# Patient Record
Sex: Male | Born: 1950 | Race: White | Hispanic: No
Health system: Southern US, Community
[De-identification: ages and names within clinical notes are randomized; demographics above are authoritative.]

## PROBLEM LIST (undated history)

## (undated) DIAGNOSIS — G8929 Other chronic pain: Secondary | ICD-10-CM

## (undated) HISTORY — PX: BACK SURGERY: SHX140

## (undated) HISTORY — PX: TOTAL SHOULDER REPLACEMENT: SUR1217

---

## 2002-02-12 ENCOUNTER — Encounter: Admission: RE | Admit: 2002-02-12 | Discharge: 2002-04-15 | Payer: Self-pay

## 2011-12-21 ENCOUNTER — Observation Stay (HOSPITAL_COMMUNITY)
Admission: EM | Admit: 2011-12-21 | Discharge: 2011-12-21 | Disposition: A | Discharge status: Home / Self Care | Payer: BC Managed Care – PPO | Source: Ambulatory Visit | Attending: Emergency Medicine | Admitting: Emergency Medicine

## 2011-12-21 ENCOUNTER — Encounter (HOSPITAL_COMMUNITY): Payer: Self-pay

## 2011-12-21 ENCOUNTER — Observation Stay (HOSPITAL_COMMUNITY): Payer: BC Managed Care – PPO

## 2011-12-21 DIAGNOSIS — R269 Unspecified abnormalities of gait and mobility: Secondary | ICD-10-CM | POA: Insufficient documentation

## 2011-12-21 DIAGNOSIS — G8929 Other chronic pain: Secondary | ICD-10-CM | POA: Insufficient documentation

## 2011-12-21 DIAGNOSIS — M542 Cervicalgia: Secondary | ICD-10-CM | POA: Insufficient documentation

## 2011-12-21 DIAGNOSIS — M549 Dorsalgia, unspecified: Principal | ICD-10-CM | POA: Insufficient documentation

## 2011-12-21 HISTORY — DX: Other chronic pain: G89.29

## 2011-12-21 LAB — CBC
MCH: 30.4 pg (ref 26.0–34.0)
MCV: 89.9 fL (ref 78.0–100.0)
Platelets: ADEQUATE 10*3/uL (ref 150–400)
RDW: 13.6 % (ref 11.5–15.5)
WBC: 18.3 10*3/uL — ABNORMAL HIGH (ref 4.0–10.5)

## 2011-12-21 LAB — DIFFERENTIAL
Basophils Absolute: 0 10*3/uL (ref 0.0–0.1)
Lymphs Abs: 1.1 10*3/uL (ref 0.7–4.0)
Monocytes Absolute: 0.9 10*3/uL (ref 0.1–1.0)
Smear Review: ADEQUATE

## 2011-12-21 LAB — BASIC METABOLIC PANEL
Calcium: 9.6 mg/dL (ref 8.4–10.5)
Creatinine, Ser: 0.77 mg/dL (ref 0.50–1.35)
GFR calc Af Amer: >90 mL/min (ref >90)
GFR calc non Af Amer: >90 mL/min (ref >90)

## 2011-12-21 MED ORDER — HYDROMORPHONE HCL PF 1 MG/ML IJ SOLN
1.0000 mg | Freq: Once | INTRAMUSCULAR | Status: AC
  Administered 2011-12-21: 1 mg via INTRAVENOUS
  Filled 2011-12-21: qty 1

## 2011-12-21 MED ORDER — ONDANSETRON HCL 4 MG/2ML IJ SOLN
4.0000 mg | Freq: Once | INTRAMUSCULAR | Status: AC
  Administered 2011-12-21: 4 mg via INTRAVENOUS
  Filled 2011-12-21: qty 2

## 2011-12-21 MED ORDER — SODIUM CHLORIDE 0.9 % IV BOLUS (SEPSIS)
1000.0000 mL | Freq: Once | INTRAVENOUS | Status: AC
  Administered 2011-12-21: 1000 mL via INTRAVENOUS

## 2011-12-21 NOTE — ED Provider Notes (Signed)
Patient arrives in CDU feeling better, can now move his legs and feels he does not want to have the MRI at this time. Discussed with Dr. Lynelle Doctor and Remi Haggard, NP, who feel that disposition home and Pain Management follow up is appropriate. Patient moves legs bilaterally without pain or difficulty now. Reflexes are equal, hyporeflexic (multiple knee surgeries).   Rodena Medin, PA-C 12/21/11 1504

## 2011-12-21 NOTE — ED Notes (Signed)
Per ems- pt chronic pain pt c/o neck and back pain, as well as bilateral leg weakness. Pt picked up from pain clinic. Pt felt a "pop" in his neck this am and has had leg weakness and unbearable pain. Pt had stimulator removed from his back today but md wanted him to go to ER for scans.

## 2011-12-21 NOTE — ED Notes (Signed)
EMS attempted to immobilize pt, pt refused as it was too painful.

## 2011-12-21 NOTE — ED Notes (Signed)
Pt asking for more pain meds before IV was removed.  PA made aware.

## 2011-12-21 NOTE — ED Notes (Signed)
Pt ambulated with a walker to restroom and back

## 2011-12-21 NOTE — ED Provider Notes (Signed)
Pt was sent here specifically for MRI testing.  Pt apparently does not want further testing at this time.  Pt is able to walk after he was given pain medications here.  Pt decided to leave against the advice of his physician who wanted MRI imaging.   Medical screening examination/treatment/procedure(s) were performed by non-physician practitioner and as supervising physician I was immediately available for consultation/collaboration.   Celene Kras, MD 12/21/11 [phone]

## 2011-12-21 NOTE — ED Provider Notes (Signed)
History     CSN: [id number]  Arrival date & time 12/21/11  1124   First MD Initiated Contact with Patient 12/21/11 1142      Chief Complaint  Patient presents with  . Back Pain  . Neck Pain    (Consider location/radiation/quality/duration/timing/severity/associated sxs/prior treatment) Patient is a 61 y.o. male presenting with back pain and neck pain. The history is provided by the patient and a friend. No language interpreter was used.  Back Pain  This is a new problem. The current episode started 3 to 5 hours ago. The problem occurs constantly. The problem has not changed since onset.The pain is associated with no known injury. The pain is present in the thoracic spine and lumbar spine. The pain is at a severity of 9/10. The pain is moderate. The symptoms are aggravated by certain positions. Associated symptoms include weakness. Pertinent negatives include no fever, no numbness, no headaches, no bowel incontinence, no perianal numbness, no bladder incontinence, no paresthesias, no paresis and no tingling. He has tried nothing for the symptoms.  Neck Pain  Associated symptoms include weakness. Pertinent negatives include no numbness, no headaches, no bowel incontinence, no bladder incontinence, no paresis and no tingling.   Chronic pain patient coming in today after his nerve stimulator was pulled out at  Dr. Marca Ancona office where he recieves care for his chronic pain clinic. Patient states that he was in his kitchen and turned and all of a sudden had severe weakness in his lower extremities and neck pain. The neck pain has somewhat resolved but he has weakness in bilateral lower extremities. Patient can move his feet and wiggle his toes but he cannot move his legs. He has good sensation to bilateral lower extremities. Multiple surgeries to his shoulders and bilateral knees. Nerve stimulator site unremarkable.  Dr. Roseanne Reno requesting MRI to r/o infection or hematoma. Moving his neck around  presently with no difficulties.   Took ms contin 100mg  and percocet 4 hours ago.  Pain to lower back 10/10.  Having to wake him up in the middle of asessement.   Past Medical History  Diagnosis Date  . Chronic pain     Past Surgical History  Procedure Date  . Back surgery   . Total shoulder replacement     No family history on file.  History  Substance Use Topics  . Smoking status: Not on file  . Smokeless tobacco: Not on file  . Alcohol Use:       Review of Systems  Constitutional: Negative for fever.  HENT: Positive for neck pain. Negative for facial swelling and neck stiffness.   Eyes: Negative.   Respiratory: Negative.  Negative for shortness of breath.   Cardiovascular: Negative.  Negative for leg swelling.  Gastrointestinal: Negative.  Negative for bowel incontinence.  Genitourinary: Negative for bladder incontinence.  Musculoskeletal: Positive for back pain and gait problem.  Neurological: Positive for weakness. Negative for dizziness, tingling, facial asymmetry, numbness, headaches and paresthesias.  Psychiatric/Behavioral: Negative.   All other systems reviewed and are negative.    Allergies  Review of patient's allergies indicates no known allergies.  Home Medications   Current Outpatient Rx  Name Route Sig Dispense Refill  . ALPRAZOLAM 1 MG PO TABS Oral Take 1 mg by mouth 4 (four) times daily.    . BUPROPION HCL ER (SR) 150 MG PO TB12 Oral Take 300 mg by mouth daily.    . IBUPROFEN 200 MG PO TABS Oral Take 600 mg by mouth  every 3 (three) hours as needed. For pain    . MORPHINE SULFATE ER 100 MG PO TBCR Oral Take 100 mg by mouth 3 (three) times daily.    . OXYCODONE-ACETAMINOPHEN 10-325 MG PO TABS Oral Take 1 tablet by mouth every 4 (four) hours as needed. For pain      BP 185/95  Pulse 64  Temp(Src) 97.4 F (36.3 C) (Oral)  Resp 21  SpO2 100%  Physical Exam  Nursing note and vitals reviewed. Constitutional: He is oriented to person, place, and  time. He appears well-developed and well-nourished.  HENT:  Head: Normocephalic.  Eyes: Conjunctivae and EOM are normal. Pupils are equal, round, and reactive to light.  Neck: Normal range of motion. Neck supple.  Cardiovascular: Normal rate.   Pulmonary/Chest: Effort normal.  Abdominal: Soft. Bowel sounds are normal. He exhibits no distension.  Musculoskeletal: Normal range of motion. He exhibits tenderness.       Lumbar tenderness  Neurological: He is alert and oriented to person, place, and time. He displays abnormal reflex. A cranial nerve deficit is present.  Skin: Skin is warm and dry.  Psychiatric: He has a normal mood and affect.    ED Course  Procedures (including critical care time) 13:30 Nursing staff reports leg movement.  With my Reasesment he could not move LE but could still move feet and had sensation. Suspect drug seeking behavior.   Labs Reviewed  CBC  DIFFERENTIAL  BASIC METABOLIC PANEL   No results found.   No diagnosis found.    MDM  *2:30 pm Moved to CDU awaiting mri of lumbar spine and thoracic spine after plain films to r/o neuro stimulator particles.          Remi Haggard, NP 12/21/11 2016

## 2011-12-21 NOTE — Discharge Instructions (Signed)
FOLLOW UP WITH DR. Jordan Likes FOR FURTHER MANAGEMENT OF BACK PAIN. CONTINUE CURRENT MEDICATIONS AS PRESCRIBED.

## 2011-12-21 NOTE — ED Notes (Signed)
Pt transported to xray 

## 2011-12-21 NOTE — ED Notes (Signed)
Patient states he cannot tolerate a closed mri. He states he needs to go so he can make some phone calls about his upcoming court case. Pa made aware.

## 2011-12-21 NOTE — ED Notes (Signed)
Pa would like pt ambulated

## 2011-12-22 NOTE — ED Provider Notes (Signed)
Medical screening examination/treatment/procedure(s) were performed by non-physician practitioner and as supervising physician I was immediately available for consultation/collaboration.    Celene Kras, MD 12/22/11 [phone]

## 2018-09-07 ENCOUNTER — Encounter: Payer: Self-pay | Admitting: Emergency Medicine

## 2018-09-07 ENCOUNTER — Other Ambulatory Visit: Payer: Self-pay

## 2018-09-07 ENCOUNTER — Emergency Department (INDEPENDENT_AMBULATORY_CARE_PROVIDER_SITE_OTHER): Payer: Medicare HMO

## 2018-09-07 ENCOUNTER — Emergency Department
Admission: EM | Admit: 2018-09-07 | Discharge: 2018-09-07 | Disposition: A | Discharge status: Home / Self Care | Payer: Medicare HMO | Source: Home / Self Care

## 2018-09-07 DIAGNOSIS — M533 Sacrococcygeal disorders, not elsewhere classified: Secondary | ICD-10-CM

## 2018-09-07 DIAGNOSIS — M25551 Pain in right hip: Secondary | ICD-10-CM

## 2018-09-07 DIAGNOSIS — L03116 Cellulitis of left lower limb: Secondary | ICD-10-CM

## 2018-09-07 DIAGNOSIS — M79651 Pain in right thigh: Secondary | ICD-10-CM | POA: Diagnosis not present

## 2018-09-07 DIAGNOSIS — L03115 Cellulitis of right lower limb: Secondary | ICD-10-CM

## 2018-09-07 DIAGNOSIS — L03113 Cellulitis of right upper limb: Secondary | ICD-10-CM

## 2018-09-07 DIAGNOSIS — L03312 Cellulitis of back [any part except buttock]: Secondary | ICD-10-CM

## 2018-09-07 LAB — POCT CBC W AUTO DIFF (K'VILLE URGENT CARE)

## 2018-09-07 LAB — COMPLETE METABOLIC PANEL WITH GFR
AG Ratio: 1 (calc) (ref 1.0–2.5)
ALT: 24 U/L (ref 9–46)
AST: 20 U/L (ref 10–35)
Albumin: 3.3 g/dL — ABNORMAL LOW (ref 3.6–5.1)
Alkaline phosphatase (APISO): 84 U/L (ref 35–144)
BUN/Creatinine Ratio: 31 (calc) — ABNORMAL HIGH (ref 6–22)
BUN: 20 mg/dL (ref 7–25)
CO2: 27 mmol/L (ref 20–32)
Calcium: 9.4 mg/dL (ref 8.6–10.3)
Chloride: 103 mmol/L (ref 98–110)
Creat: 0.65 mg/dL — ABNORMAL LOW (ref 0.70–1.25)
GFR, Est African American: 118 mL/min/{1.73_m2} (ref >=60)
GFR, Est Non African American: 101 mL/min/{1.73_m2} (ref >=60)
Globulin: 3.4 g/dL (calc) (ref 1.9–3.7)
Glucose, Bld: 79 mg/dL (ref 65–99)
Potassium: 4.4 mmol/L (ref 3.5–5.3)
Sodium: 137 mmol/L (ref 135–146)
Total Bilirubin: 0.3 mg/dL (ref 0.2–1.2)
Total Protein: 6.7 g/dL (ref 6.1–8.1)

## 2018-09-07 MED ORDER — CEFTRIAXONE SODIUM 1 G IJ SOLR
1.0000 g | Freq: Once | INTRAMUSCULAR | Status: AC
  Administered 2018-09-07: 1 g via INTRAMUSCULAR

## 2018-09-07 MED ORDER — CLINDAMYCIN HCL 300 MG PO CAPS: 300.0000 mg | ORAL_CAPSULE | Freq: Four times a day (QID) | ORAL | 0 refills | Status: AC

## 2018-09-07 NOTE — ED Provider Notes (Signed)
[facility]    CSN: [id number] Arrival date & time: 09/07/18  1552     History   Chief Complaint Chief Complaint  Patient presents with  . Hip Pain    HPI Donald Morton is a 68 y.o. male.   HPI  Donald Morton is a 68 y.o. male presenting to UC with c/o right hip pain, bilateral lower leg pain with swelling. Pt has a hx of Right hip fracture with ORIF a few years ago. He uses crutches to help himself ambulate but he is concerned the rod is about to pierce through the skin near his knee.  He is also concerned he has MRSA in his feet. He was seen by his PCP about 1 week ago, started on bactrim but denies improvement of his legs.  Denies fever, chills, n/v/d.  Pt's brother came during exam and pointed out that pt has a skin sore on his buttock over his spine. Pt believes from dragging himself on the ground due to recent falls and hip pain.   Past Medical History:  Diagnosis Date  . Chronic pain     There are no active problems to display for this patient.   Past Surgical History:  Procedure Laterality Date  . BACK SURGERY    . TOTAL SHOULDER REPLACEMENT         Home Medications    Prior to Admission medications   Medication Sig Start Date End Date Taking? Authorizing Provider  sulfamethoxazole-trimethoprim (BACTRIM DS,SEPTRA DS) 800-160 MG tablet Take 1 tablet by mouth 2 (two) times daily.   Yes [provider]  ALPRAZolam Prudy Feeler) 1 MG tablet Take 1 mg by mouth 4 (four) times daily.    [provider]  buPROPion (WELLBUTRIN SR) 150 MG 12 hr tablet Take 300 mg by mouth daily.    [provider]  clindamycin (CLEOCIN) 300 MG capsule Take 1 capsule (300 mg total) by mouth 4 (four) times daily. X 7 days 09/07/18   Lurene Shadow, PA-C  ibuprofen (ADVIL,MOTRIN) 200 MG tablet Take 600 mg by mouth every 3 (three) hours as needed. For pain    [provider]  morphine (MS CONTIN) 100 MG 12 hr tablet Take 100 mg by mouth  3 (three) times daily.    [provider]  oxyCODONE-acetaminophen (PERCOCET) 10-325 MG per tablet Take 1 tablet by mouth every 4 (four) hours as needed. For pain    [provider]    Family History History reviewed. No pertinent family history.  Social History Social History   Tobacco Use  . Smoking status: Not on file  Substance Use Topics  . Alcohol use: Not on file  . Drug use: Not on file     Allergies   Latex and Venlafaxine   Review of Systems Review of Systems  Constitutional: Negative for chills and fever.  Musculoskeletal: Positive for arthralgias, back pain, gait problem, joint swelling and myalgias. Negative for neck pain and neck stiffness.  Skin: Positive for color change and wound.  Neurological: Negative for weakness and numbness.     Physical Exam Triage Vital Signs ED Triage Vitals  Enc Vitals Group     BP      Pulse      Resp      Temp      Temp src      SpO2      Weight      Height      Head Circumference  Peak Flow      Pain Score      Pain Loc      Pain Edu?      Excl. in GC?    No data found.  Updated Vital Signs BP 113/75 (BP Location: Right Arm)   Pulse 88   Temp 98.3 F (36.8 C) (Oral)   Resp 18   Ht 6\' 1"  (1.854 m)   Wt 131 lb (59.4 kg)   SpO2 96%   BMI 17.28 kg/m   Visual Acuity Right Eye Distance:   Left Eye Distance:   Bilateral Distance:    Right Eye Near:   Left Eye Near:    Bilateral Near:     Physical Exam Vitals signs and nursing note reviewed.  Constitutional:      Appearance: He is well-developed. He is ill-appearing.     Comments: Pt appears disheveled. Has crutches.   HENT:     Head: Normocephalic and atraumatic.     Nose: Nose normal.     Mouth/Throat:     Mouth: Mucous membranes are moist.  Neck:     Musculoskeletal: Normal range of motion.  Cardiovascular:     Rate and Rhythm: Normal rate and regular rhythm.  Pulmonary:     Effort: Pulmonary effort is normal.      Breath sounds: Normal breath sounds.  Abdominal:     General: There is no distension.     Palpations: Abdomen is soft.     Tenderness: There is no abdominal tenderness.  Musculoskeletal:        General: Tenderness present.     Comments: Tenderness to Right hip and bilateral lower legs Right thigh: palpable bulge Right distal medial thigh, hardware? Skin tenting but in tact. Tenderness to lower lumbar/sacrum (wound)  Skin:    General: Skin is warm and dry.     Capillary Refill: Capillary refill takes less than 2 seconds.     Findings: Abrasion, erythema and wound present.          Comments: Right wrist: erythema, warmth Lower lumbar/sacral area Right side: 5x6cm erythema centralized abrasion  Bilateral lower legs: erythema and warmth from toes to mid calf. Multiple shallow ulcerations. Dried scabs on Left foot, moist wounds on Right foot.   Neurological:     Mental Status: He is alert and oriented to person, place, and time.  Psychiatric:        Behavior: Behavior normal.      UC Treatments / Results  Labs (all labs ordered are listed, but only abnormal results are displayed) Labs Reviewed  COMPLETE METABOLIC PANEL WITH GFR - Abnormal; Notable for the following components:      Result Value   Creat 0.65 (*)    BUN/Creatinine Ratio 31 (*)    Albumin 3.3 (*)    All other components within normal limits  POCT CBC W AUTO DIFF ([facility])    EKG None  Radiology Dg Pelvis 1-2 Views  Result Date: 09/07/2018 CLINICAL DATA:  Multiple falls. Right posterior hip pain. Sacral/coccygeal pain. EXAM: PELVIS - 1-2 VIEW COMPARISON:  None. FINDINGS: Extensive hardware noted in the lower lumbar spine extending into the pelvis as well as the proximal right femur. Degenerative changes in the hips with joint space narrowing and spurring. Probable old healed left inferior and superior pubic rami fractures. No acute fracture, subluxation or dislocation. IMPRESSION: No acute bony  abnormality. Electronically Signed   By: Charlett Nose M.D.   On: 09/07/2018 17:41  Dg Sacrum/coccyx  Result Date: 09/07/2018 CLINICAL DATA:  Multiple falls.  Sacral/coccygeal pain. EXAM: SACRUM AND COCCYX - 2+ VIEW COMPARISON:  12/21/2011 lumbar spine FINDINGS: Extensive hardware in the lower lumbar spine extending into the pelvis. No acute fracture, subluxation or dislocation. Probable old healed left superior and inferior pubic rami fractures. IMPRESSION: No acute bony abnormality. Electronically Signed   By: Charlett Nose M.D.   On: 09/07/2018 17:42   Dg Femur Min 2 Views Right  Result Date: 09/07/2018 CLINICAL DATA:  Multiple falls.  Right femur pain. EXAM: RIGHT FEMUR 2 VIEWS COMPARISON:  None. FINDINGS: Extensive hardware in the right femur from prior internal fixation. There is fracture through the proximal screw in the right distal femoral lateral plate. Prior right knee replacement. No definite acute bony abnormality. No visible acute fracture, subluxation or dislocation. IMPRESSION: Evidence of remote trauma and internal fixation within the right femur as well as right knee replacement. No definite acute bony abnormality. Electronically Signed   By: Charlett Nose M.D.   On: 09/07/2018 17:40    Procedures Procedures (including critical care time)  Medications Ordered in UC Medications  cefTRIAXone (ROCEPHIN) injection 1 g (1 g Intramuscular Given 09/07/18 1823)    Initial Impression / Assessment and Plan / UC Course  I have reviewed the triage vital signs and the nursing notes.  Pertinent labs & imaging results that were available during my care of the patient were reviewed by me and considered in my medical decision making (see chart for details).     Pt presenting to UC with diffuse ares of cellulitis and open ulcerations on feet.  Scabbed ulceration on lower back/upper buttock. Advised pt he'd benefit from going to the ED for IV antibiotics, and possible admission.  Pt refused  EMS transport. Pt states he has cats at home he needs to feed first, will go later today or tomorrow  Rocephin 1g IM given in UC Clindamycin sent to pharmacy on file as pt may not go to ED CBC is reassuring as well as vitals: WNL  Final Clinical Impressions(s) / UC Diagnoses   Final diagnoses:  Bilateral lower leg cellulitis  Cellulitis of lower back  Cellulitis of right wrist  Right hip pain     Discharge Instructions     You have refused EMS transport to the hospital this afternoon.   Due to the severity of your skin infections on your lower back, wrist, and both legs even after being on the bactrim for 1 week, it is HIGHLY recommended that you go to the hospital today for further evaluation and treatment.  You would likely benefit from IV antibiotics and you may even be admitted for a few days to help get the infection better under control.   If the infection gets into your blood stream or your spine, it can cause something called sepsis and make you very sick. You might need a foot or leg amputated if it becomes too infected. Infection in the spine can cause you to become paralyzed.  If untreated it can even cause death very quickly.  Please have a friend of family member drive you to the hospital tonight after you feed your cats.  Call 911 or an Uber/taxi if you need to.     ED Prescriptions    Medication Sig Dispense Auth. Provider   clindamycin (CLEOCIN) 300 MG capsule Take 1 capsule (300 mg total) by mouth 4 (four) times daily. X 7 days 28 capsule Lurene Shadow, [place]  Controlled Substance Prescriptions [place] Controlled Substance Registry consulted? Not Applicable   Rolla Plate 09/08/18 1120

## 2018-09-07 NOTE — Discharge Instructions (Signed)
You have refused EMS transport to the hospital this afternoon.   Due to the severity of your skin infections on your lower back, wrist, and both legs even after being on the bactrim for 1 week, it is HIGHLY recommended that you go to the hospital today for further evaluation and treatment.  You would likely benefit from IV antibiotics and you may even be admitted for a few days to help get the infection better under control.   If the infection gets into your blood stream or your spine, it can cause something called sepsis and make you very sick. You might need a foot or leg amputated if it becomes too infected. Infection in the spine can cause you to become paralyzed.  If untreated it can even cause death very quickly.  Please have a friend of family member drive you to the hospital tonight after you feed your cats.  Call 911 or an Uber/taxi if you need to.

## 2018-09-07 NOTE — ED Triage Notes (Signed)
The patient presented to the UC with a complaint of right hip secondary to a fall x 1 week.

## 2018-09-08 ENCOUNTER — Telehealth: Payer: Self-pay | Admitting: Emergency Medicine

## 2018-09-08 NOTE — Telephone Encounter (Signed)
No answer

## 2018-09-09 ENCOUNTER — Telehealth: Payer: Self-pay | Admitting: *Deleted

## 2018-09-10 ENCOUNTER — Telehealth: Payer: Self-pay | Admitting: *Deleted

## 2018-09-10 MED ORDER — CLONIDINE HCL 0.1 MG PO TABS
.10 | ORAL_TABLET | ORAL | Status: DC
Start: ? — End: 2018-09-10

## 2018-09-10 MED ORDER — TRAMADOL HCL 50 MG PO TABS
50.00 | ORAL_TABLET | ORAL | Status: DC
Start: ? — End: 2018-09-10

## 2018-09-10 MED ORDER — GENERIC EXTERNAL MEDICATION
4.00 | Status: DC
Start: ? — End: 2018-09-10

## 2018-09-10 MED ORDER — GUAIFENESIN 100 MG/5ML PO LIQD
200.00 | ORAL | Status: DC
Start: ? — End: 2018-09-10

## 2018-09-10 MED ORDER — ACETAMINOPHEN 650 MG RE SUPP
650.00 | RECTAL | Status: DC
Start: ? — End: 2018-09-10

## 2018-09-10 MED ORDER — GENERIC EXTERNAL MEDICATION
1.00 | Status: DC
Start: 2018-09-11 — End: 2018-09-10

## 2018-09-10 MED ORDER — GENERIC EXTERNAL MEDICATION
650.00 | Status: DC
Start: ? — End: 2018-09-10

## 2018-09-10 MED ORDER — GENERIC EXTERNAL MEDICATION
10.00 | Status: DC
Start: ? — End: 2018-09-10

## 2018-09-10 MED ORDER — NITROGLYCERIN 0.4 MG SL SUBL
.40 | SUBLINGUAL_TABLET | SUBLINGUAL | Status: DC
Start: ? — End: 2018-09-10

## 2018-09-10 MED ORDER — GENERIC EXTERNAL MEDICATION
Status: DC
Start: ? — End: 2018-09-10

## 2018-09-10 MED ORDER — KETOROLAC TROMETHAMINE 15 MG/ML IJ SOLN
15.00 | INTRAMUSCULAR | Status: DC
Start: ? — End: 2018-09-10

## 2018-09-10 MED ORDER — ALUM & MAG HYDROXIDE-SIMETH 200-200-20 MG/5ML PO SUSP
30.00 | ORAL | Status: DC
Start: ? — End: 2018-09-10

## 2018-09-10 MED ORDER — GENERIC EXTERNAL MEDICATION
1.25 | Status: DC
Start: 2018-09-11 — End: 2018-09-10

## 2018-09-10 MED ORDER — SODIUM CHLORIDE 0.9 % IV SOLN
10.00 | INTRAVENOUS | Status: DC
Start: ? — End: 2018-09-10

## 2018-09-10 MED ORDER — GABAPENTIN 100 MG PO CAPS
100.00 | ORAL_CAPSULE | ORAL | Status: DC
Start: 2018-09-10 — End: 2018-09-10

## 2018-09-10 MED ORDER — ENOXAPARIN SODIUM 40 MG/0.4ML ~~LOC~~ SOLN
40.00 | SUBCUTANEOUS | Status: DC
Start: 2018-09-11 — End: 2018-09-10

## 2018-09-10 MED ORDER — METHYLPHENIDATE HCL 10 MG PO TABS
20.00 | ORAL_TABLET | ORAL | Status: DC
Start: 2018-09-11 — End: 2018-09-10

## 2018-09-10 MED ORDER — ACETAMINOPHEN 325 MG PO TABS
650.00 | ORAL_TABLET | ORAL | Status: DC
Start: ? — End: 2018-09-10

## 2018-09-10 MED ORDER — POLYETHYLENE GLYCOL 3350 17 G PO PACK
17.00 | PACK | ORAL | Status: DC
Start: ? — End: 2018-09-10

## 2018-09-10 NOTE — Telephone Encounter (Signed)
Pt has been called multiple times since 09/08/18 to give lab results . No answer. Care everywhere shows that the pt is currently admitted to [facility]. He has had repeat CMP since admission.

## 2018-09-13 ENCOUNTER — Telehealth: Payer: Self-pay | Admitting: *Deleted

## 2019-09-13 IMAGING — DX DG SACRUM/COCCYX 2+V
3 series · 3 of 3 positions shown · non-contrast
Comparison: 12/21/2011 lumbar spine

CLINICAL DATA: Multiple falls.  Sacral/coccygeal pain.

EXAM:
SACRUM AND COCCYX - 2+ VIEW

[coccyx ap]
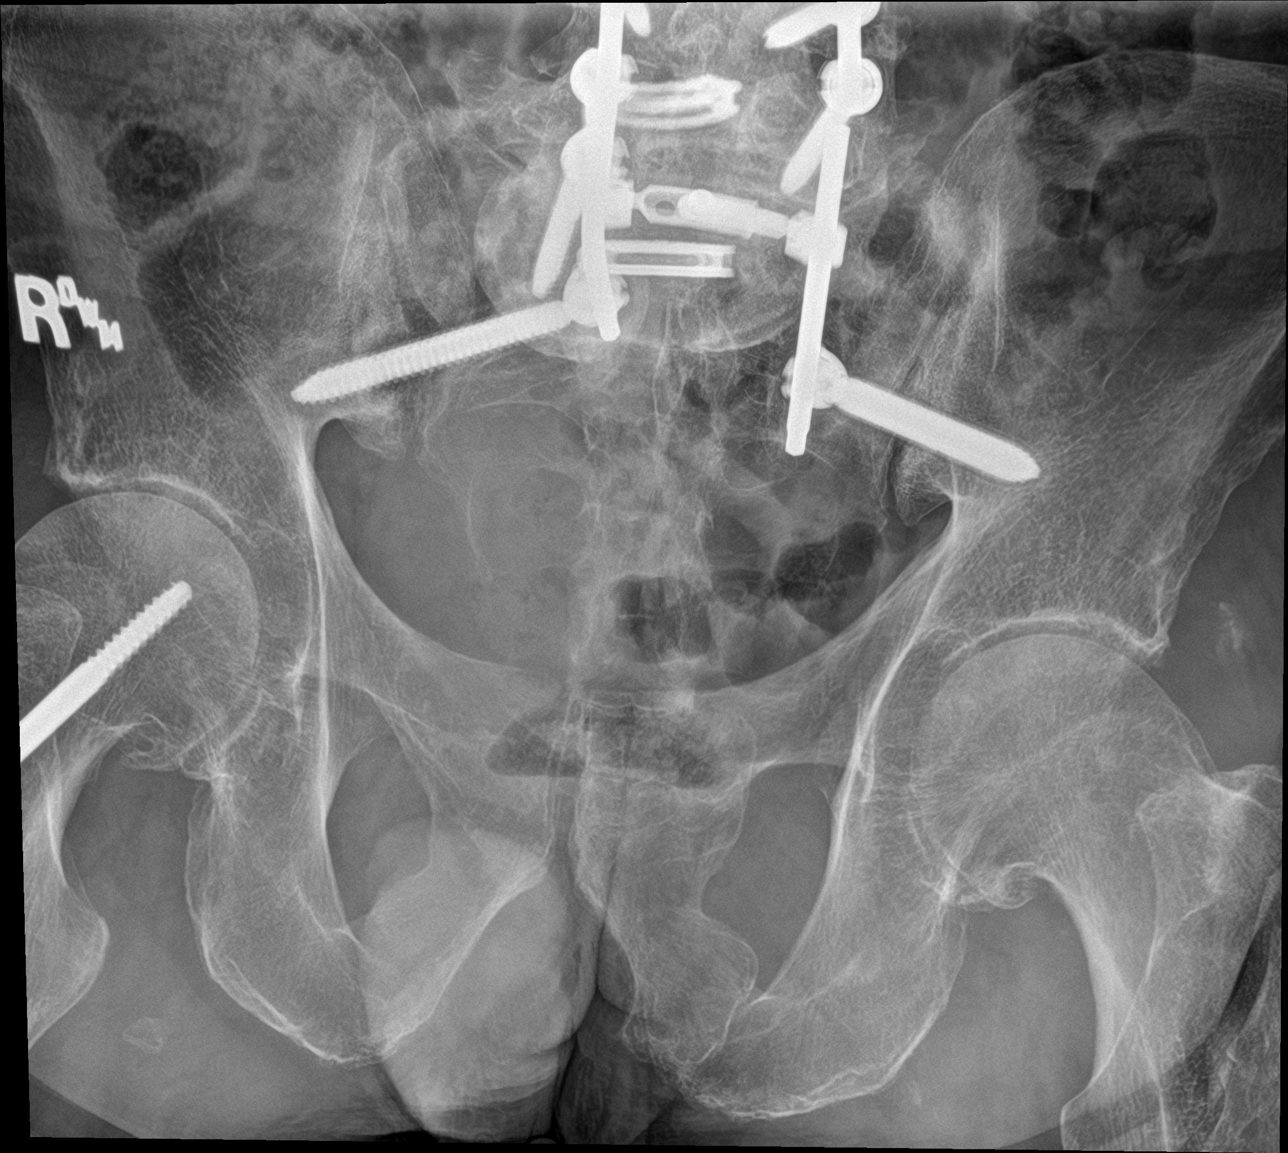

[sacrum ap]
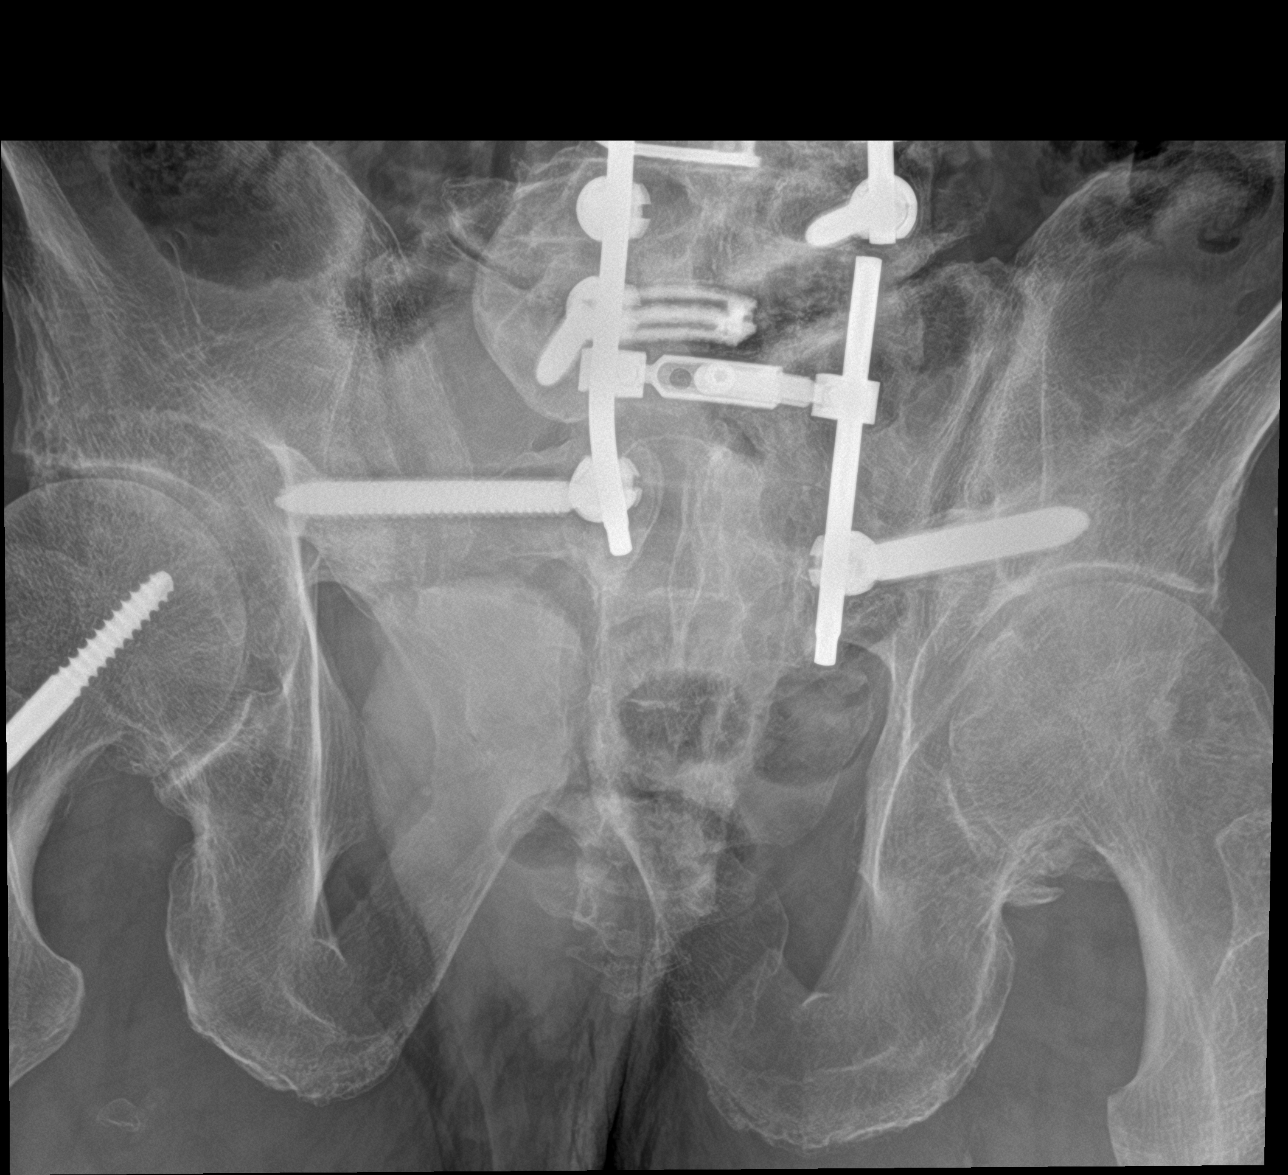

[sacrum lat]
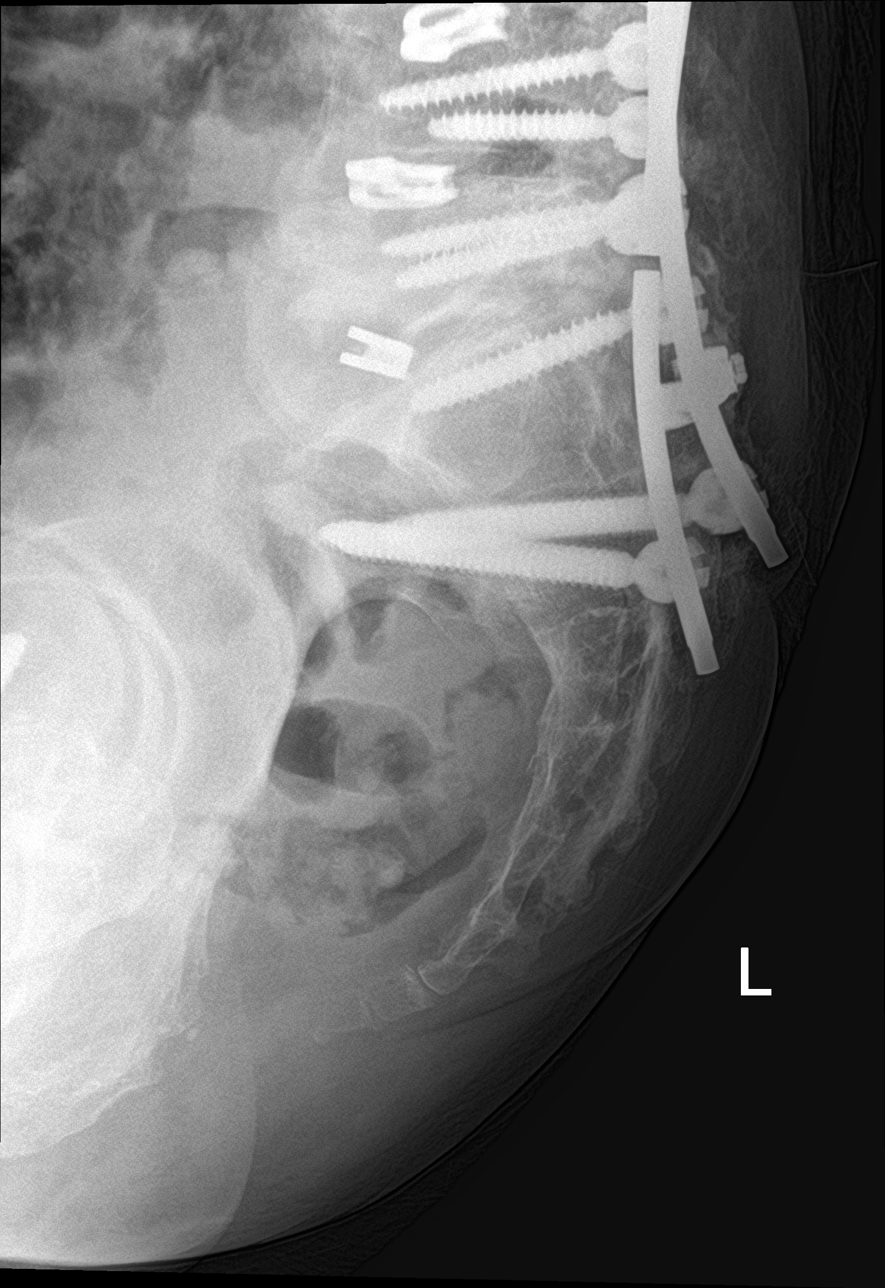

[3 of 3 positions shown; findings below may reference images not displayed]

FINDINGS: Extensive hardware in the lower lumbar spine extending into the
pelvis. No acute fracture, subluxation or dislocation. Probable old
healed left superior and inferior pubic rami fractures..
IMPRESSION: No acute bony abnormality.

## 2020-02-15 DEATH — deceased
